# Patient Record
Sex: Female | Born: 2000 | Race: White | Hispanic: No | Marital: Single
Health system: Southern US, Community
[De-identification: ages and names within clinical notes are randomized; demographics above are authoritative.]

## PROBLEM LIST (undated history)

## (undated) DIAGNOSIS — Z20828 Contact with and (suspected) exposure to other viral communicable diseases: Secondary | ICD-10-CM

## (undated) DIAGNOSIS — G43909 Migraine, unspecified, not intractable, without status migrainosus: Secondary | ICD-10-CM

## (undated) DIAGNOSIS — J3501 Chronic tonsillitis: Secondary | ICD-10-CM

## (undated) HISTORY — PX: TYMPANOSTOMY TUBE PLACEMENT: SHX32

---
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
## 2020-08-17 ENCOUNTER — Ambulatory Visit (INDEPENDENT_AMBULATORY_CARE_PROVIDER_SITE_OTHER)
Admit: 2020-08-17 | Discharge: 2020-08-17 | Disposition: A | Discharge status: Home / Self Care | Payer: BC Managed Care – PPO | Attending: Internal Medicine | Admitting: Internal Medicine

## 2020-08-17 ENCOUNTER — Encounter: Payer: Self-pay | Admitting: Emergency Medicine

## 2020-08-17 ENCOUNTER — Ambulatory Visit
Admission: EM | Admit: 2020-08-17 | Discharge: 2020-08-17 | Disposition: A | Discharge status: Home / Self Care | Payer: BC Managed Care – PPO | Attending: Physician Assistant | Admitting: Physician Assistant

## 2020-08-17 ENCOUNTER — Other Ambulatory Visit: Payer: Self-pay

## 2020-08-17 DIAGNOSIS — N946 Dysmenorrhea, unspecified: Secondary | ICD-10-CM | POA: Insufficient documentation

## 2020-08-17 DIAGNOSIS — R102 Pelvic and perineal pain: Secondary | ICD-10-CM

## 2020-08-17 LAB — COMPREHENSIVE METABOLIC PANEL
ALT: 23 U/L (ref 0–44)
AST: 25 U/L (ref 15–41)
Albumin: 3.8 g/dL (ref 3.5–5.0)
Alkaline Phosphatase: 69 U/L (ref 38–126)
Anion gap: 9 (ref 5–15)
BUN: 12 mg/dL (ref 6–20)
CO2: 24 mmol/L (ref 22–32)
Calcium: 8.2 mg/dL — ABNORMAL LOW (ref 8.9–10.3)
Chloride: 103 mmol/L (ref 98–111)
Creatinine, Ser: 0.73 mg/dL (ref 0.44–1.00)
GFR calc Af Amer: >60 mL/min (ref >60)
GFR calc non Af Amer: >60 mL/min (ref >60)
Glucose, Bld: 96 mg/dL (ref 70–99)
Potassium: 3.3 mmol/L — ABNORMAL LOW (ref 3.5–5.1)
Sodium: 136 mmol/L (ref 135–145)
Total Bilirubin: 0.3 mg/dL (ref 0.3–1.2)
Total Protein: 6.7 g/dL (ref 6.5–8.1)

## 2020-08-17 LAB — CBC WITH DIFFERENTIAL/PLATELET
Abs Immature Granulocytes: 0.04 10*3/uL (ref 0.00–0.07)
Basophils Absolute: 0.1 10*3/uL (ref 0.0–0.1)
Basophils Relative: 1 %
Eosinophils Absolute: 0.1 10*3/uL (ref 0.0–0.5)
Eosinophils Relative: 1 %
HCT: 38.4 % (ref 36.0–46.0)
Hemoglobin: 12.7 g/dL (ref 12.0–15.0)
Immature Granulocytes: 0 %
Lymphocytes Relative: 16 %
Lymphs Abs: 1.7 10*3/uL (ref 0.7–4.0)
MCH: 28.9 pg (ref 26.0–34.0)
MCHC: 33.1 g/dL (ref 30.0–36.0)
MCV: 87.3 fL (ref 80.0–100.0)
Monocytes Absolute: 0.7 10*3/uL (ref 0.1–1.0)
Monocytes Relative: 6 %
Neutro Abs: 8 10*3/uL — ABNORMAL HIGH (ref 1.7–7.7)
Neutrophils Relative %: 76 %
Platelets: 282 10*3/uL (ref 150–400)
RBC: 4.4 MIL/uL (ref 3.87–5.11)
RDW: 11.9 % (ref 11.5–15.5)
WBC: 10.5 10*3/uL (ref 4.0–10.5)
nRBC: 0 % (ref 0.0–0.2)

## 2020-08-17 LAB — URINALYSIS, COMPLETE (UACMP) WITH MICROSCOPIC
Bacteria, UA: NONE SEEN
Bilirubin Urine: NEGATIVE
Glucose, UA: NEGATIVE mg/dL
Ketones, ur: NEGATIVE mg/dL
Leukocytes,Ua: NEGATIVE
Nitrite: NEGATIVE
Protein, ur: NEGATIVE mg/dL
Specific Gravity, Urine: 1.01 (ref 1.005–1.030)
WBC, UA: NONE SEEN WBC/hpf (ref 0–5)
pH: 6.5 (ref 5.0–8.0)

## 2020-08-17 LAB — CHLAMYDIA/NGC RT PCR (ARMC ONLY)
Chlamydia Tr: NOT DETECTED
N gonorrhoeae: NOT DETECTED

## 2020-08-17 LAB — PREGNANCY, URINE: Preg Test, Ur: NEGATIVE

## 2020-08-17 LAB — WET PREP, GENITAL
Clue Cells Wet Prep HPF POC: NONE SEEN
Sperm: NONE SEEN
Trich, Wet Prep: NONE SEEN
WBC, Wet Prep HPF POC: NONE SEEN
Yeast Wet Prep HPF POC: NONE SEEN

## 2020-08-17 MED ORDER — JUNEL 1/20 1-20 MG-MCG PO TABS: 1.0000 | ORAL_TABLET | Freq: Every day | ORAL | 0 refills | Status: AC

## 2020-08-17 MED ORDER — KETOROLAC TROMETHAMINE 60 MG/2ML IM SOLN
60.0000 mg | Freq: Once | INTRAMUSCULAR | Status: AC
  Administered 2020-08-17: 60 mg via INTRAMUSCULAR

## 2020-08-17 MED ORDER — KETOROLAC TROMETHAMINE 10 MG PO TABS: 10.0000 mg | ORAL_TABLET | Freq: Four times a day (QID) | ORAL | 0 refills | Status: AC | PRN

## 2020-08-17 NOTE — ED Provider Notes (Signed)
[facility]    CSN: [id number] Arrival date & time: 08/17/20  2353      History   Chief Complaint Chief Complaint  Patient presents with  . Abdominal Pain    HPI Kathy VANDERHOOF is a 19 y.o. female.   Patient presents for lower abdominal pain for a month.  She states that during her menstrual periods she gets severe pelvic pain.  She says that she has had pain since yesterday when her period started.  She denies heavy bleeding.  She denies any abnormal vaginal discharge, odor, lesions.  She is sexually active with one partner and denies any current concern for STIs.  Admits to history of ruptured ovarian cyst in July 2021.  She says she was just treated for the pain at that time.  States she sees a gynecologist who recently started her on Junel oral contraceptive.  Patient denies any fever, fatigue, dizziness, nausea, vomiting, chest pain, breathing difficulty, diarrhea, constipation, dysuria, hematuria, vaginal discharge, vaginal lesions or itching, or back pain.  Patient has no other concerns today.  HPI  Past Medical History:  Diagnosis Date  . Chronic tonsillitis   . Migraine   . 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   Past Surgical History:  Procedure Laterality Date  . TONSILLECTOMY AND ADENOIDECTOMY Bilateral 04/27/2016   Procedure: TONSILLECTOMy;  Surgeon: Vernie Murders, MD;  Location: [facility];  Service: ENT;  Laterality: Bilateral;  . TYMPANOSTOMY TUBE PLACEMENT      OB History   No obstetric history on file.      Home Medications    Prior to Admission medications   Medication Sig Start Date End Date Taking? Authorizing Provider  clindamycin (CLEOCIN) 300 MG capsule Take 300 mg by mouth 3 (three) times daily.   Yes [provider]  escitalopram (LEXAPRO) 20 MG tablet Take 20 mg by mouth daily. 07/27/20  Yes [provider]  loratadine (CLARITIN) 10 MG tablet Take 10 mg by mouth daily.  Reported on 04/27/2016   Yes [provider]  omeprazole (PRILOSEC) 40 MG capsule Take 40 mg by mouth at bedtime. 03/03/20  Yes [provider]  QUEtiapine (SEROQUEL XR) 300 MG 24 hr tablet Take 300 mg by mouth at bedtime. 07/27/20  Yes [provider]  traZODone (DESYREL) 50 MG tablet  06/22/20  Yes [provider]  JUNEL 1/20 1-20 MG-MCG tablet Take 1 tablet by mouth daily. 08/17/20   Shirlee Latch, PA-C  ketorolac (TORADOL) 10 MG tablet Take 1 tablet (10 mg total) by mouth every 6 (six) hours as needed for up to 5 days. 08/17/20 08/22/20  Shirlee Latch, PA-C    Family History Family History  Problem Relation Age of Onset  . Healthy Mother   . Healthy Father     Social History Social History   Tobacco Use  . Smoking status: Passive Smoke Exposure - Never Smoker  . Smokeless tobacco: Never Used  Substance Use Topics  . Alcohol use: No  . Drug use: Never     Allergies   Patient has no known allergies.   Review of Systems Review of Systems  Constitutional: Negative for chills, diaphoresis, fatigue and fever.  HENT: Negative for congestion, rhinorrhea and sore throat.   Respiratory: Negative for cough and shortness of breath.   Cardiovascular: Negative for chest pain.  Gastrointestinal: Positive for abdominal pain. Negative for constipation, diarrhea, nausea and vomiting.  Genitourinary:  Positive for pelvic pain and vaginal bleeding. Negative for dysuria, flank pain, frequency, hematuria, vaginal discharge and vaginal pain.  Musculoskeletal: Negative for arthralgias and myalgias.  Skin: Negative for rash.  Neurological: Negative for weakness and headaches.     Physical Exam Triage Vital Signs ED Triage Vitals  Enc Vitals Group     BP 08/17/20 0947 123/73     Pulse Rate 08/17/20 0947 65     Resp 08/17/20 0947 18     Temp 08/17/20 0947 97.9 F (36.6 C)     Temp Source 08/17/20 0947 Oral     SpO2 08/17/20 0947 99 %     Weight 08/17/20  0945 145 lb (65.8 kg)     Height 08/17/20 0945 5\' 4"  (1.626 m)     Head Circumference --      Peak Flow --      Pain Score 08/17/20 0945 8     Pain Loc --      Pain Edu? --      Excl. in GC? --    No data found.  Updated Vital Signs BP 123/73 (BP Location: Right Arm)   Pulse 65   Temp 97.9 F (36.6 C) (Oral)   Resp 18   Ht 5\' 4"  (1.626 m)   Wt 145 lb (65.8 kg)   LMP 08/16/2020   SpO2 99%   BMI 24.89 kg/m      Physical Exam Vitals and nursing note reviewed.  Constitutional:      General: She is not in acute distress.    Appearance: Normal appearance. She is not ill-appearing or toxic-appearing.  HENT:     Head: Normocephalic and atraumatic.     Nose: Nose normal.     Mouth/Throat:     Mouth: Mucous membranes are moist.     Pharynx: Oropharynx is clear.  Eyes:     General: No scleral icterus.       Right eye: No discharge.        Left eye: No discharge.     Conjunctiva/sclera: Conjunctivae normal.  Cardiovascular:     Rate and Rhythm: Normal rate and regular rhythm.     Heart sounds: Normal heart sounds.  Pulmonary:     Effort: Pulmonary effort is normal. No respiratory distress.     Breath sounds: Normal breath sounds.  Abdominal:     Palpations: Abdomen is soft.     Tenderness: There is abdominal tenderness (suprapubic, LLQ, RLQ). There is no right CVA tenderness or left CVA tenderness.  Genitourinary:    General: Normal vulva.     Vagina: Bleeding present. No vaginal discharge.     Cervix: Cervical motion tenderness and cervical bleeding present. No discharge.     Comments: Patient had pain with the speculum exam making it difficult to fully visualize her cervix. Musculoskeletal:     Cervical back: Neck supple.  Skin:    General: Skin is dry.  Neurological:     General: No focal deficit present.     Mental Status: She is alert. Mental status is at baseline.     Motor: No weakness.     Gait: Gait normal.  Psychiatric:        Mood and Affect: Mood  normal.        Behavior: Behavior normal.        Thought Content: Thought content normal.      UC Treatments / Results  Labs (all labs ordered are listed, but only abnormal results are displayed)  Labs Reviewed  URINALYSIS, COMPLETE (UACMP) WITH MICROSCOPIC - Abnormal; Notable for the following components:      Result Value   Hgb urine dipstick SMALL (*)    All other components within normal limits  CBC WITH DIFFERENTIAL/PLATELET - Abnormal; Notable for the following components:   Neutro Abs 8.0 (*)    All other components within normal limits  COMPREHENSIVE METABOLIC PANEL - Abnormal; Notable for the following components:   Potassium 3.3 (*)    Calcium 8.2 (*)    All other components within normal limits  WET PREP, GENITAL  CHLAMYDIA/NGC RT PCR ([facility] ONLY)  PREGNANCY, URINE    EKG   Radiology US PELVIC COMPLETE WITH TRANSVAGINAL  Result Date: 08/17/2020 CLINICAL DATA:  Pelvic pain for 2 days EXAM: TRANSABDOMINAL AND TRANSVAGINAL ULTRASOUND OF PELVIS TECHNIQUE: Both transabdominal and transvaginal ultrasound examinations of the pelvis were performed. Transabdominal technique was performed for global imaging of the pelvis including uterus, ovaries, adnexal regions, and pelvic cul-de-sac. It was necessary to proceed with endovaginal exam following the transabdominal exam to visualize the ovaries. COMPARISON:  None FINDINGS: Uterus Measurements: 6.7 x 2.9 x 4.1 cm. = volume: 42 mL. No fibroids or other mass visualized. Endometrium Thickness: 6.2 mm.  No focal abnormality visualized. Right ovary Measurements: 2.7 x 1.1 x 1.5 cm. = volume: 2.3 mL. Normal appearance/no adnexal mass. Left ovary Measurements: 2.4 x 1.1 x 1.5 cm. = volume: 2 mL. Normal appearance/no adnexal mass. Other findings Trace pelvic fluid is noted likely physiologic in nature. IMPRESSION: No acute abnormality noted. Electronically Signed   By: Alcide Clever M.D.   On: 08/17/2020 13:20     Procedures Procedures (including critical care time)  Medications Ordered in UC Medications  ketorolac (TORADOL) injection 60 mg (60 mg Intramuscular Given 08/17/20 1024)    Initial Impression / Assessment and Plan / UC Course  I have reviewed the triage vital signs and the nursing notes.  Pertinent labs & imaging results that were available during my care of the patient were reviewed by me and considered in my medical decision making (see chart for details).   19 year old female presenting for pelvic pain/dysmenorrhea.  On exam patient has diffuse tenderness of the suprapubic region, left lower abdomen and pelvic region, and right lower abdomen and pelvic region.  No CVA tenderness.  On pelvic exam, patient had difficulty tolerating this.  Unable to fully visualize cervix due to her guarding.  Only small amount of blood in the vaginal vault.  She did have pain with the exam.  Patient given 60 mg IM ketorolac for pain.  Urine pregnancy negative.  Urinalysis only small blood.  Wet prep normal.  CBC and CMP without significant abnormality.  Pelvic ultrasound ordered.  DDx includes appendicitis, PID, ovarian cyst, ectopic pregnancy, endometriosis, ovarian torsion.  Pelvic ultrasound within the only limits today.  Swab obtained during pelvic exam for gonorrhea and chlamydia testing.  Advised patient we can cover for PID with antibiotics since she has severe pelvic pain.  Patient says she is not concerned about this possibility and would like to wait for results of the STI testing first.  Advised patient to follow-up with her gynecologist regarding her dysmenorrhea to find out why it is still painful.  Explained that there is possibility she could have endometriosis.  She stated that she had a lot of relief with the ketorolac injection so I have prescribed this for her to take orally.  She also requested refill of her Junel so I  have read fill that for her.  Patient advised to follow-up with  gynecologist or to go to ED sooner for any severe worsening pelvic pain or any associated fevers or intractable vomiting or weakness.  Patient understanding and agreeable.   Final Clinical Impressions(s) / UC Diagnoses   Final diagnoses:  Dysmenorrhea  Pelvic pain in female     Discharge Instructions     Multiple labs obtained today.  So far all labs essentially normal.  Awaiting gonorrhea and Chlamydia testing.  You've declined treatment for this today.  Pelvic ultrasound within normal limits today.  As discussed there are many other causes of painful periods.  Since the ketorolac injection helped you, I have prescribed the pill form of this medication.  You can take Tylenol with this as well.  Try using heating pad.  I have refilled your Junel.  Please follow-up with your gynecologist for further work-up for these painful periods.    ED Prescriptions    Medication Sig Dispense Auth. Provider   JUNEL 1/20 1-20 MG-MCG tablet Take 1 tablet by mouth daily. 28 tablet Eusebio Friendly B, PA-C   ketorolac (TORADOL) 10 MG tablet Take 1 tablet (10 mg total) by mouth every 6 (six) hours as needed for up to 5 days. 20 tablet Gareth Morgan     PDMP not reviewed this encounter.   Shirlee Latch, PA-C 08/18/20 1423

## 2020-08-17 NOTE — ED Triage Notes (Signed)
Patient c/o abdominal pain that started last month.  Patient states she was put on birth control at the beginning of the summer and she stopped having her period. Gradually she stated her period has returned the last 2 months.

## 2020-08-17 NOTE — Discharge Instructions (Signed)
Multiple labs obtained today.  So far all labs essentially normal.  Awaiting gonorrhea and Chlamydia testing.  You've declined treatment for this today.  Pelvic ultrasound within normal limits today.  As discussed there are many other causes of painful periods.  Since the ketorolac injection helped you, I have prescribed the pill form of this medication.  You can take Tylenol with this as well.  Try using heating pad.  I have refilled your Junel.  Please follow-up with your gynecologist for further work-up for these painful periods.

## 2021-05-26 IMAGING — US US PELVIS COMPLETE WITH TRANSVAGINAL
1 series · 14 of 25 positions shown · non-contrast
Comparison: None

CLINICAL DATA: Pelvic pain for 2 days



[Series 1: us pelvis complete with transvaginal · 0.21mm/px · 90 acquisitions, 14 frames shown]
[im 1/90]
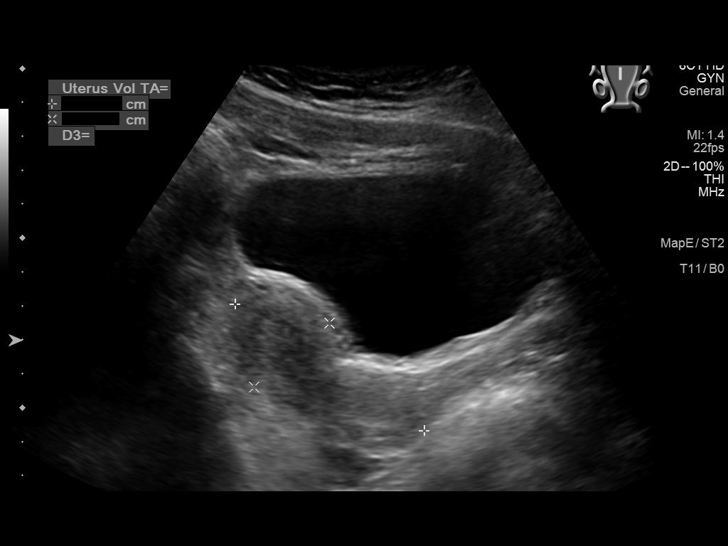
[im 8/90]
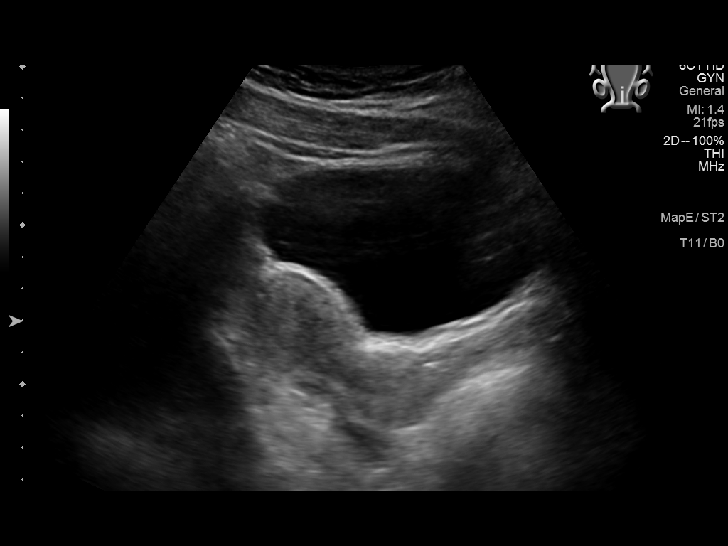
[im 15/90]
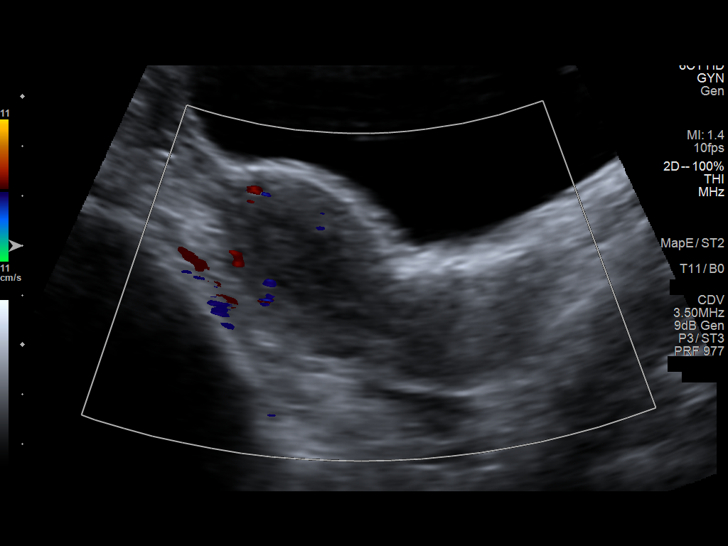
[im 23/90]
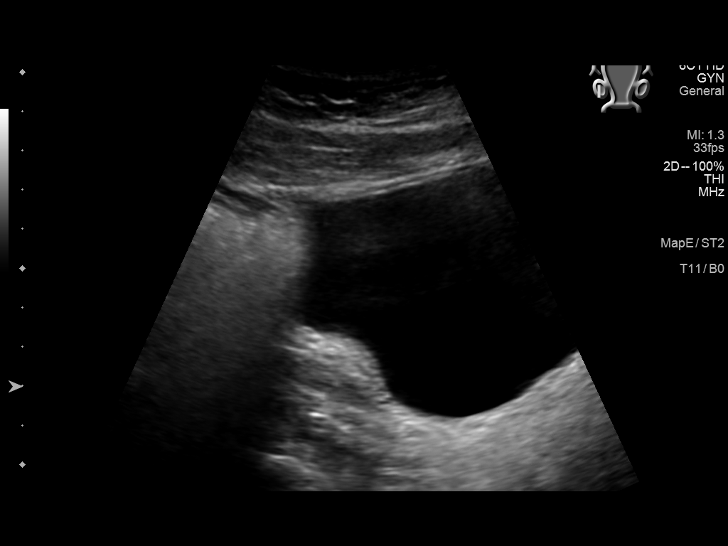
[im 30/90]
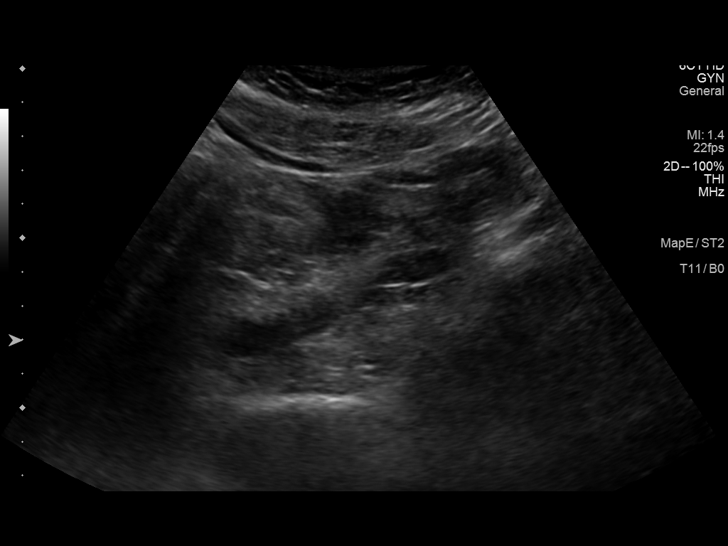
[im 34/90]
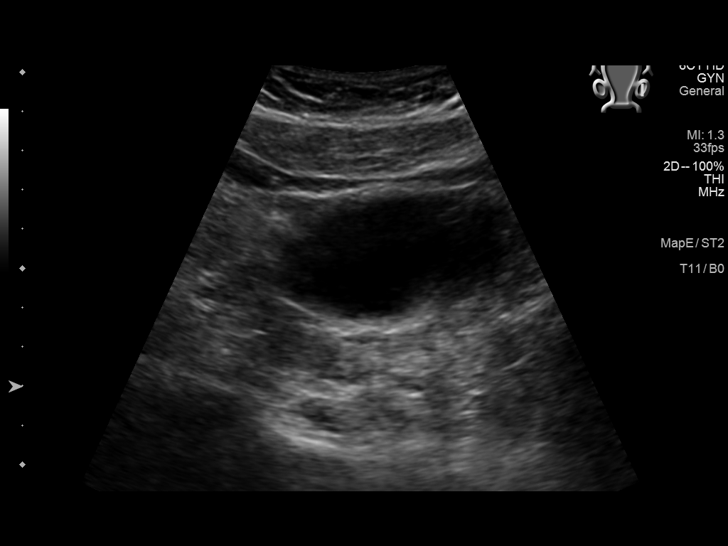
[im 41/90]
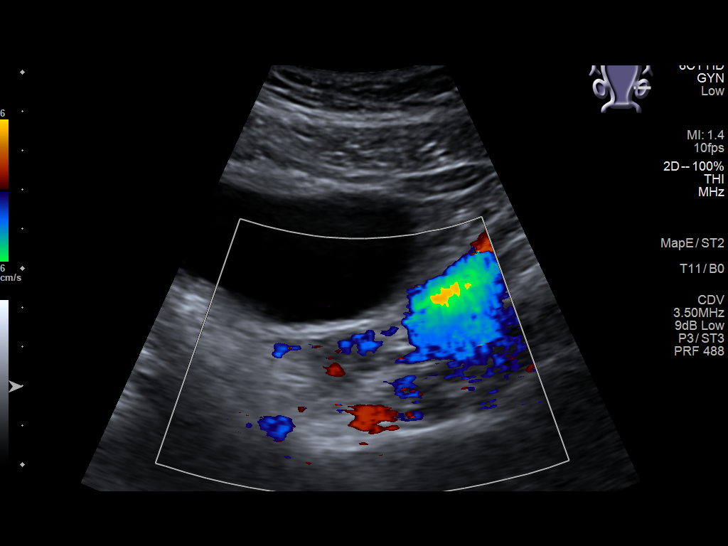
[im 49/90]
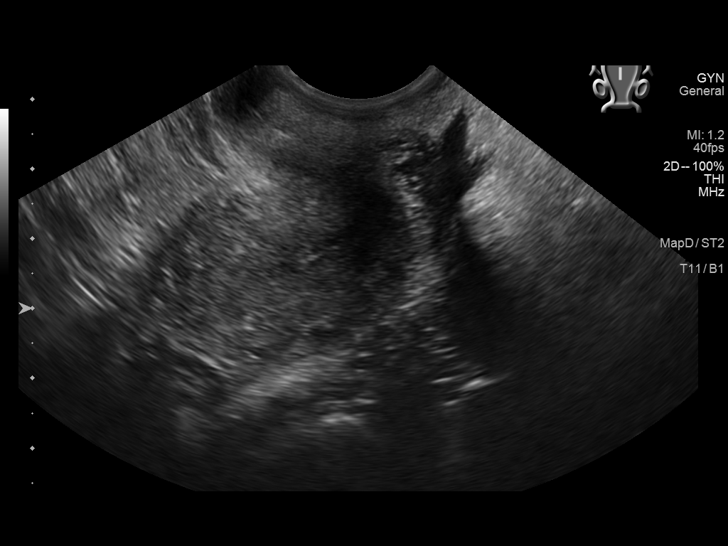
[im 56/90]
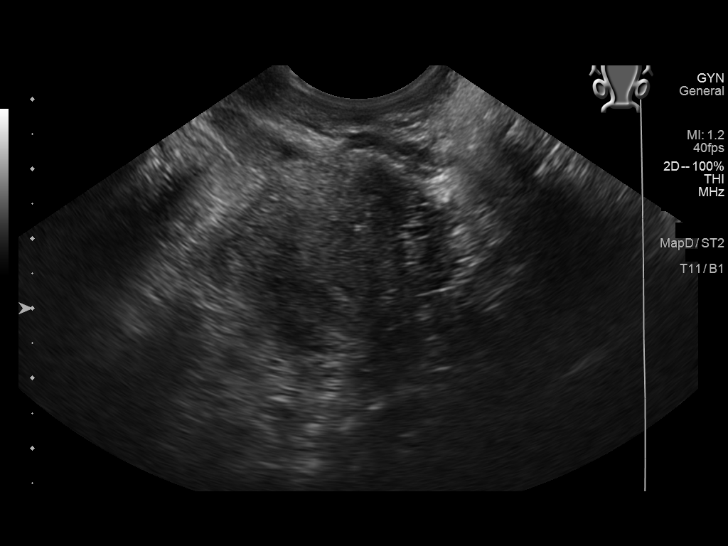
[im 60/90]
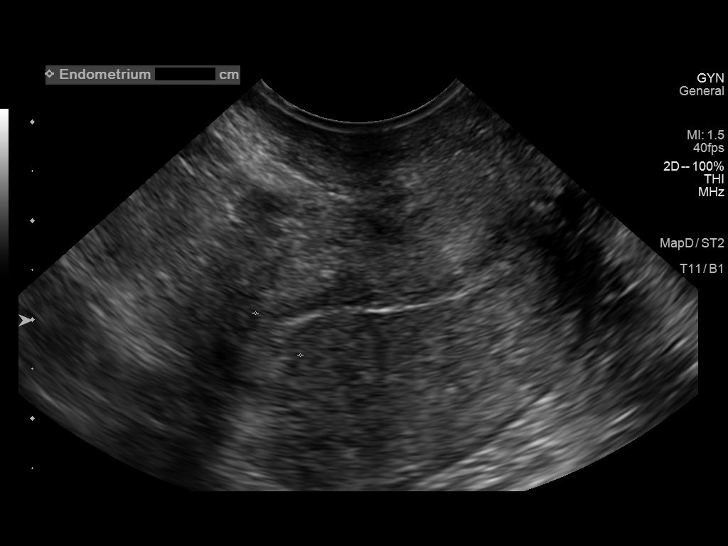
[im 67/90]
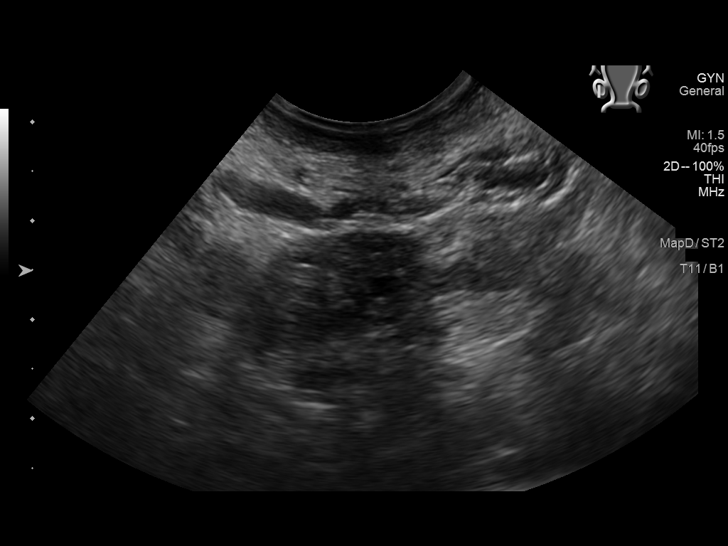
[im 75/90]
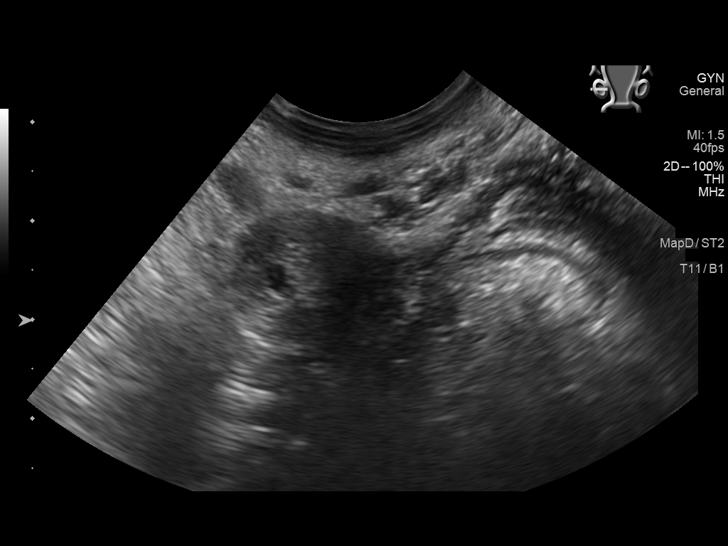
[im 82/90]
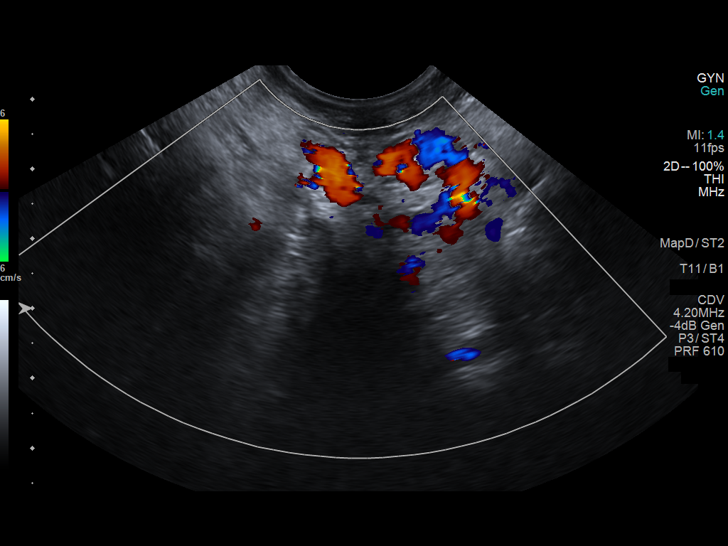
[im 90/90]
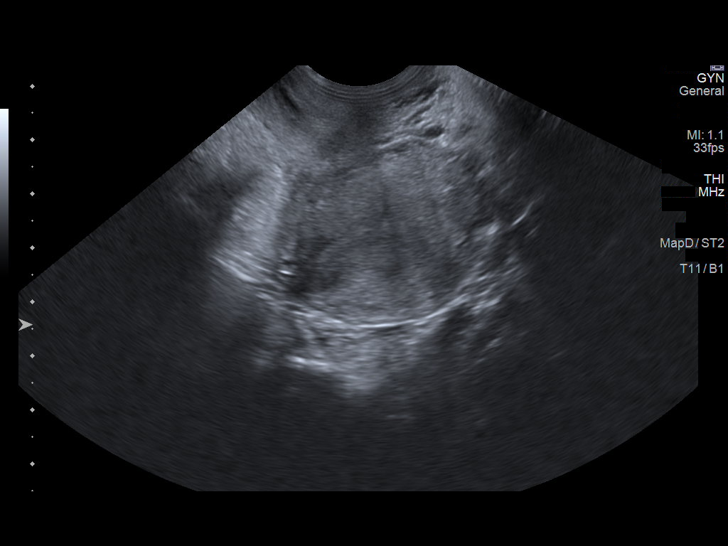

[14 of 25 positions shown; findings below may reference images not displayed]

FINDINGS: Uterus

Measurements: 6.7 x 2.9 x 4.1 cm. = volume: 42 mL. No fibroids or
other mass visualized.

Endometrium

Thickness: 6.2 mm.  No focal abnormality visualized.

Right ovary

Measurements: 2.7 x 1.1 x 1.5 cm. = volume: 2.3 mL. Normal
appearance/no adnexal mass.

Left ovary

Measurements: 2.4 x 1.1 x 1.5 cm. = volume: 2 mL. Normal
appearance/no adnexal mass.

Other findings

Trace pelvic fluid is noted likely physiologic in nature.
IMPRESSION: No acute abnormality noted.
